# Patient Record
Sex: Male | Born: 2000 | Race: White | Hispanic: No | Marital: Single | Smoking: Never smoker
Health system: Southern US, Community
[De-identification: ages and names within clinical notes are randomized; demographics above are authoritative.]

## PROBLEM LIST (undated history)

## (undated) HISTORY — PX: TONSILLECTOMY AND ADENOIDECTOMY: SUR1326

---
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
## 2020-06-08 IMAGING — DX DG ANKLE COMPLETE 3+V*L*
3 series · 3 of 3 positions shown · non-contrast
Comparison: None.

CLINICAL DATA: Trauma while playing basketball 2 weeks ago with
lateral malleolus pain and swelling.

EXAM:
LEFT ANKLE COMPLETE - 3+ VIEW

[ankle ap]
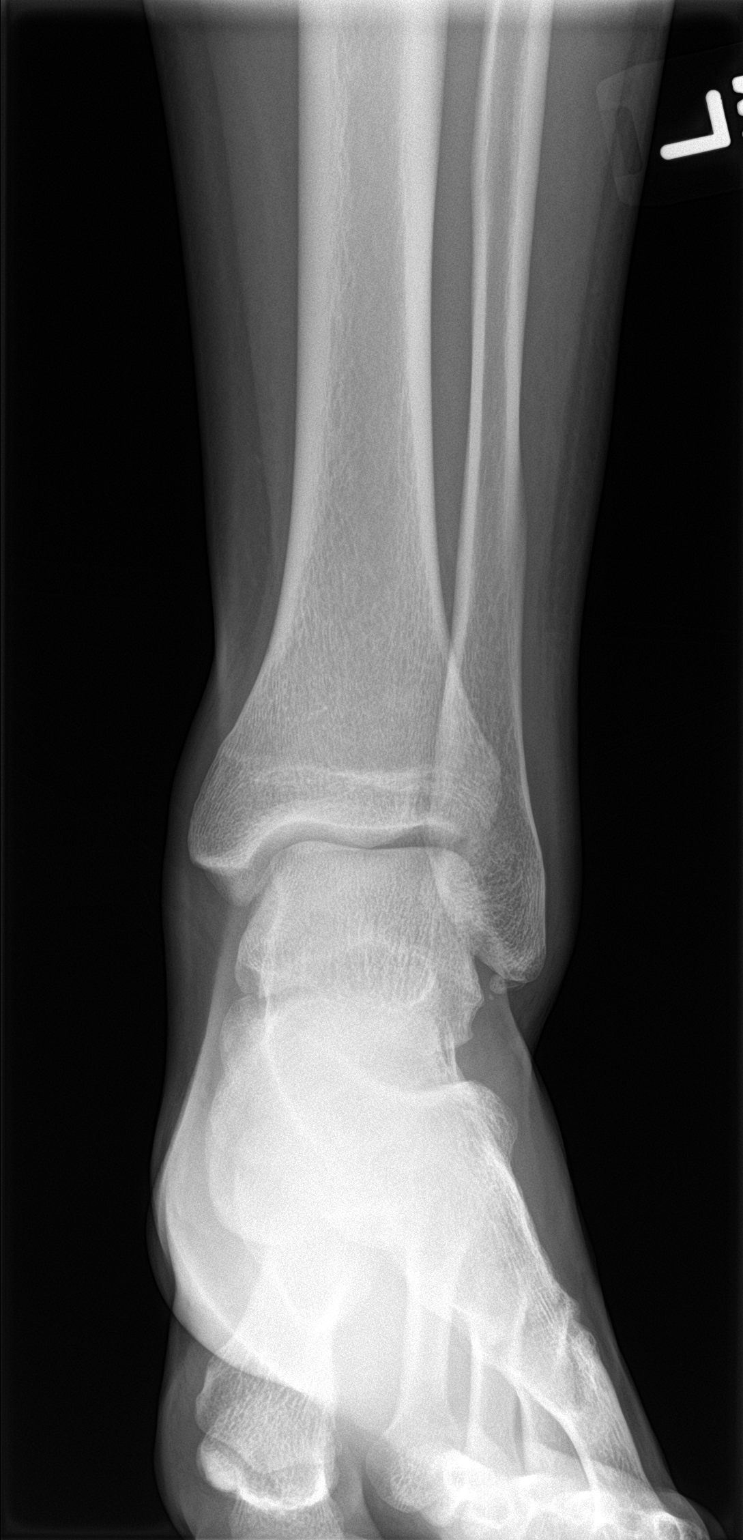

[ankle obl]
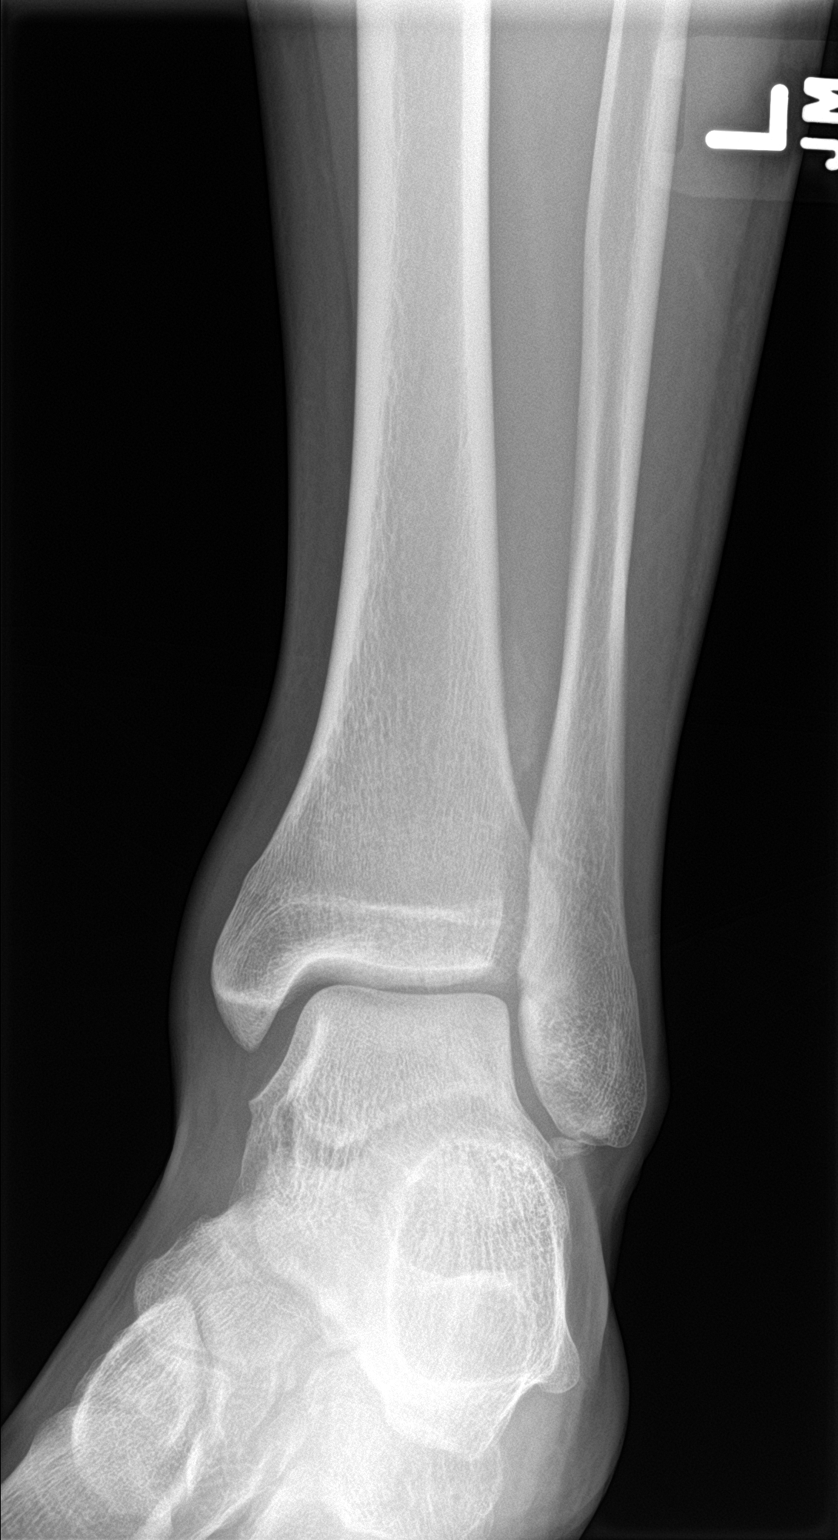

[ankle lat]
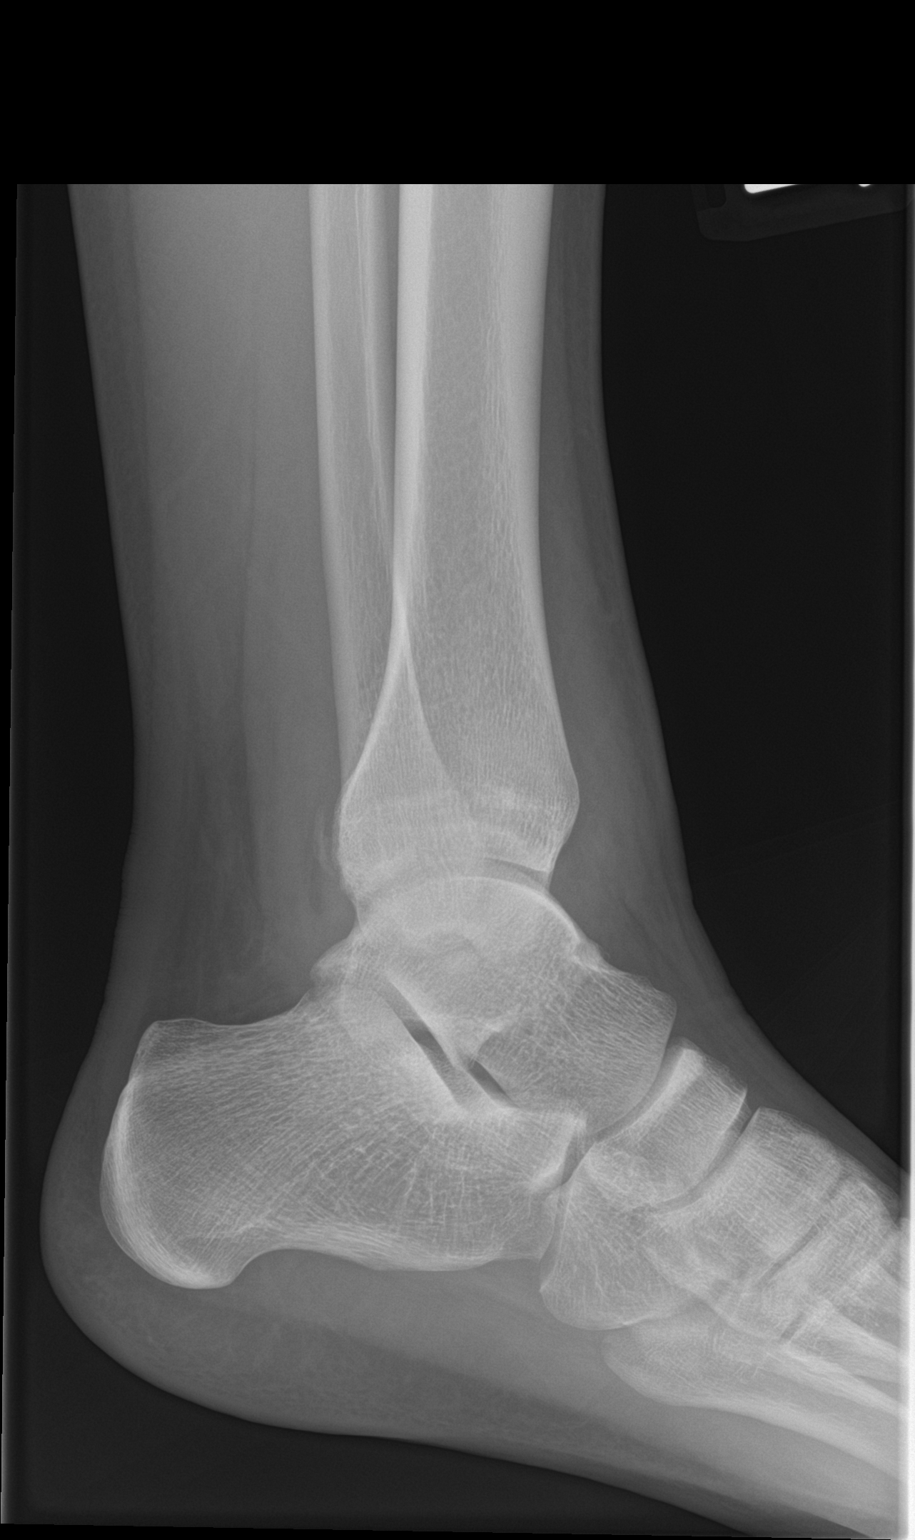

[3 of 3 positions shown; findings below may reference images not displayed]

FINDINGS: There is no evidence of fracture, dislocation, or joint effusion.
There is a corticated bony density inferior to the fibula, chronic.
Minimal swelling of the soft tissues about the ankle is identified.
IMPRESSION: No acute fracture or dislocation. Corticated bony density inferior
to the fibula, chronic.
